# Patient Record
Sex: Female | Born: 2000 | Race: White | Hispanic: No | Marital: Single | Smoking: Never smoker
Health system: Southern US, Community
[De-identification: ages and names within clinical notes are randomized; demographics above are authoritative.]

---
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
## 2019-10-08 ENCOUNTER — Other Ambulatory Visit: Payer: Self-pay

## 2019-10-08 ENCOUNTER — Ambulatory Visit: Payer: BLUE CROSS/BLUE SHIELD | Attending: Internal Medicine

## 2019-10-08 DIAGNOSIS — Z20822 Contact with and (suspected) exposure to covid-19: Secondary | ICD-10-CM

## 2019-10-09 LAB — NOVEL CORONAVIRUS, NAA: SARS-CoV-2, NAA: NOT DETECTED
